# Patient Record
Sex: Female | Born: 1985 | Race: Black or African American | Hispanic: No | Marital: Single | Smoking: Never smoker
Health system: Southern US, Community
[De-identification: ages and names within clinical notes are randomized; demographics above are authoritative.]

---

## 2005-07-14 ENCOUNTER — Inpatient Hospital Stay: Payer: Self-pay | Admitting: Obstetrics and Gynecology

## 2006-10-31 ENCOUNTER — Emergency Department (HOSPITAL_COMMUNITY): Admission: EM | Admit: 2006-10-31 | Discharge: 2006-10-31 | Payer: Self-pay | Admitting: Emergency Medicine

## 2011-01-22 ENCOUNTER — Encounter (HOSPITAL_COMMUNITY): Payer: Self-pay

## 2011-01-22 ENCOUNTER — Emergency Department (HOSPITAL_COMMUNITY)
Admission: EM | Admit: 2011-01-22 | Discharge: 2011-01-22 | Disposition: A | Discharge status: Home / Self Care | Payer: Managed Care, Other (non HMO) | Attending: Emergency Medicine | Admitting: Emergency Medicine

## 2011-01-22 DIAGNOSIS — H6692 Otitis media, unspecified, left ear: Secondary | ICD-10-CM

## 2011-01-22 DIAGNOSIS — H6690 Otitis media, unspecified, unspecified ear: Secondary | ICD-10-CM | POA: Insufficient documentation

## 2011-01-22 MED ORDER — AMOXICILLIN 250 MG PO CAPS
500.0000 mg | ORAL_CAPSULE | Freq: Once | ORAL | Status: AC
  Administered 2011-01-22: 500 mg via ORAL
  Filled 2011-01-22: qty 1

## 2011-01-22 MED ORDER — AMOXICILLIN 500 MG PO CAPS: 500.0000 mg | ORAL_CAPSULE | Freq: Three times a day (TID) | ORAL | Status: AC

## 2011-01-22 MED ORDER — ANTIPYRINE-BENZOCAINE 5.4-1.4 % OT SOLN
3.0000 [drp] | Freq: Once | OTIC | Status: AC
  Administered 2011-01-22: 4 [drp] via OTIC
  Filled 2011-01-22 (×2): qty 10

## 2011-01-22 NOTE — ED Notes (Signed)
Lt earache, since this am. Scratchy throat.

## 2011-01-22 NOTE — ED Notes (Signed)
Pt reports earache in left ear and some sinus drainage since this morning.

## 2011-01-22 NOTE — ED Provider Notes (Signed)
History     CSN: [id number]  Arrival date & time 01/22/11  1647   First MD Initiated Contact with Patient 01/22/11 1720      Chief Complaint  Patient presents with  . Otalgia    (Consider location/radiation/quality/duration/timing/severity/associated sxs/prior treatment) HPI Comments: Patient c/o left ear pain since this morning.  She denies dizziness, hearing loss, or drainage.    Patient is a 26 y.o. female presenting with ear pain. The history is provided by the patient.  Otalgia This is a new problem. The current episode started 3 to 5 hours ago. There is pain in the left ear. The problem occurs constantly. The problem has not changed since onset.There has been no fever. The pain is moderate. Pertinent negatives include no ear discharge, no headaches, no hearing loss, no rhinorrhea, no sore throat, no vomiting, no neck pain, no cough and no rash. Her past medical history does not include hearing loss.    History reviewed. No pertinent past medical history.  Past Surgical History  Procedure Date  . Cesarean section     History reviewed. No pertinent family history.  History  Substance Use Topics  . Smoking status: Never Smoker   . Smokeless tobacco: Not on file  . Alcohol Use: No    OB History    Grav Para Term Preterm Abortions TAB SAB Ect Mult Living                  Review of Systems  Constitutional: Negative for fever and chills.  HENT: Positive for ear pain and sinus pressure. Negative for hearing loss, sore throat, facial swelling, rhinorrhea, trouble swallowing, neck pain and ear discharge.   Eyes: Negative for pain.  Respiratory: Negative for cough.   Gastrointestinal: Negative for vomiting.  Skin: Negative for rash.  Neurological: Negative for dizziness, numbness and headaches.  All other systems reviewed and are negative.    Allergies  Review of patient's allergies indicates no known allergies.  Home Medications  No current outpatient  prescriptions on file.  BP 117/64  Pulse 74  Temp(Src) 99.1 F (37.3 C) (Oral)  Resp 16  Ht 5\' 6"  (1.676 m)  Wt 217 lb (98.431 kg)  BMI 35.02 kg/m2  SpO2 99%  LMP 01/02/2011  Physical Exam  Nursing note and vitals reviewed. Constitutional: She is oriented to person, place, and time. She appears well-developed and well-nourished. No distress.  HENT:  Head: Normocephalic and atraumatic.  Right Ear: Tympanic membrane and ear canal normal.  Left Ear: No lacerations. No drainage. No mastoid tenderness. Tympanic membrane is erythematous. Tympanic membrane is not perforated. No hemotympanum.  Mouth/Throat: Uvula is midline, oropharynx is clear and moist and mucous membranes are normal.  Eyes: EOM are normal. Pupils are equal, round, and reactive to light.  Neck: Normal range of motion. Neck supple.  Cardiovascular: Normal rate, regular rhythm and normal heart sounds.   No murmur heard. Pulmonary/Chest: Effort normal and breath sounds normal.  Lymphadenopathy:    She has no cervical adenopathy.  Neurological: She is oriented to person, place, and time.  Skin: Skin is warm and dry.    ED Course  Procedures (including critical care time)       MDM    Patient is alert, NAd.  Non-toxic appearing.  Left otitis media        Kinston Magnan L. Jazmene Racz, [place] 01/22/11 1742

## 2011-01-22 NOTE — ED Provider Notes (Signed)
Medical screening examination/treatment/procedure(s) were performed by non-physician practitioner and as supervising physician I was immediately available for consultation/collaboration.   Caylea Foronda L Gearldene Fiorenza, MD 01/22/11 2044 

## 2013-05-20 ENCOUNTER — Observation Stay: Payer: Self-pay

## 2013-05-20 LAB — URINALYSIS, COMPLETE
Bacteria: NONE SEEN
Bilirubin,UR: NEGATIVE
Blood: NEGATIVE
Glucose,UR: NEGATIVE mg/dL (ref 0–75)
KETONE: NEGATIVE
Leukocyte Esterase: NEGATIVE
Nitrite: NEGATIVE
PH: 6 (ref 4.5–8.0)
PROTEIN: NEGATIVE
RBC,UR: 3 /HPF (ref 0–5)
SPECIFIC GRAVITY: 1.023 (ref 1.003–1.030)

## 2013-06-27 ENCOUNTER — Inpatient Hospital Stay: Payer: Self-pay

## 2013-06-27 LAB — CBC WITH DIFFERENTIAL/PLATELET
BASOS ABS: 0 10*3/uL (ref 0.0–0.1)
Basophil %: 0.2 %
EOS PCT: 0.6 %
Eosinophil #: 0.1 10*3/uL (ref 0.0–0.7)
HCT: 36.5 % (ref 35.0–47.0)
HGB: 11.9 g/dL — ABNORMAL LOW (ref 12.0–16.0)
LYMPHS ABS: 2 10*3/uL (ref 1.0–3.6)
LYMPHS PCT: 16.9 %
MCH: 26.5 pg (ref 26.0–34.0)
MCHC: 32.5 g/dL (ref 32.0–36.0)
MCV: 82 fL (ref 80–100)
MONOS PCT: 6.6 %
Monocyte #: 0.8 x10 3/mm (ref 0.2–0.9)
NEUTROS PCT: 75.7 %
Neutrophil #: 9.1 10*3/uL — ABNORMAL HIGH (ref 1.4–6.5)
PLATELETS: 211 10*3/uL (ref 150–440)
RBC: 4.48 10*6/uL (ref 3.80–5.20)
RDW: 15.1 % — ABNORMAL HIGH (ref 11.5–14.5)
WBC: 12 10*3/uL — ABNORMAL HIGH (ref 3.6–11.0)

## 2013-06-28 LAB — HEMATOCRIT: HCT: 31.1 % — ABNORMAL LOW (ref 35.0–47.0)

## 2013-06-28 LAB — PATHOLOGY REPORT

## 2014-05-06 NOTE — Op Note (Signed)
PATIENT NAME:  Carmen Dudley, Carmen Dudley MR#:  [id number] DATE OF BIRTH:  [birth year]  DATE OF PROCEDURE:  06/27/2013  PREOPERATIVE DIAGNOSES: 1. A 38 + 6 weeks estimated gestational age.  2. Active labor.  3. Prior cesarean section.  4. Elective permanent sterilization.   POSTOPERATIVE DIAGNOSES: 1. A 38 + 6 weeks estimated gestational age.  2. Active labor.  3. Prior cesarean section.  4. Elective permanent sterilization.   PROCEDURE: 1. Repeat low transverse cesarean section.  3. Bilateral tubal ligation, [place].   ANESTHESIA: Spinal.  SURGEON: Jennell Cornerhomas Asahel Risden, M.D.   INDICATION: A 29 year old gravida 2, para 1 patient with EDC of 07/05/2013 presented with active contractions. The cervix was dilated with 2 to 3 cm and patient was scheduled for a repeat cesarean section and elective permanent sterilization.   PROCEDURE: After adequate spinal anesthesia, the patient was placed in the dorsal supine position with a hip roll under the right side. The patient previously received 3 grams IV Ancef prior to commencement of the case. The patient's abdomen was prepped and draped in normal sterile fashion. A time out was performed and everybody was in agreement of the planned procedure. A Pfannenstiel incision was made two fingerbreadths above the symphysis pubis. Sharp dissection was used to identify the fascia. The fascia was opened in the midline and opened in a transverse fashion. The superior aspect of the fascia was grasped with Kocher clamps and the recti muscles dissected free. The inferior aspect of the fascia was grasped with Kocher clamps and pyramidalis muscle was dissected free. Entry into the peritoneal cavity was accomplished sharply. The vesicouterine peritoneal fold was identified and was opened and a bladder flap was created and the bladder was reflected inferiorly. A low transverse uterine incision was made. Upon entry into the endometrial cavity, thin meconium stained fluid was noted.  The fetal head was delivered through the incision followed by shoulders and the body. A vigorous spontaneous cry on the abdomen was noted. Oral and nasopharynx were suctioned. A vigorous female passed to nursery staff that assigned Apgar scores of 9 and 9. Placenta was manually delivered and the uterus was exteriorized and wiped clean with laparotomy tape. The ring forceps was used to open the cervix and this was passed off the operative field. The uterine incision was closed with 1 chromic suture in a running locking fashion with good approximation of edges. Good hemostasis was noted. The posterior cul-de-sac was irrigated and suctioned.   Attention was then directed to the patient's right fallopian tube there was grasped at the midportion of the fallopian tube. Two separate 0 plain gut sutures were applied and a 1.5 cm portion of fallopian tube was removed. Good hemostasis was noted. A similar procedure was repeated on the patient's left fallopian tube. After placing two separate 0 plain gut sutures, a 1.5 cm portion of fallopian tube was removed. The uterus was placed back into the abdominal cavity.  The paracolic gutters were wiped clean with laparotomy tape and the tubal ligation sites appeared hemostatic. There was one rectal muscle bleeder that was noted and a figure-of-eight was placed with 2-0 Vicryl with good hemostasis. The uterine incision again appeared hemostatic. Interceed was placed over the uterine incision in a T-shaped fashion. The On-Q pump catheters were then placed from an infraumbilical position to a silk fascial position and the fascia was then closed over top these with 0 Vicryl suture in a running nonlocking fashion. Subcutaneous tissues were irrigated and bovied for hemostasis and the skin  was reapproximated with staples. On-Q pump catheters were secured at the skin level with Dermabond and were anchored to the skin with Steri-Strips and a Tegaderm was placed over top of this. Each  catheter was loaded with 5 mL of 0.5% lidocaine.   ESTIMATED BLOOD LOSS: 700 mL   INTRAOPERATIVE FLUIDS: 1300 mL.   The patient tolerated the procedure well and was taken to the recovery room in good condition.    ____________________________ Suzy Bouchard, MD tjs:sg D: 06/27/2013 13:46:00 ET T: 06/27/2013 16:24:27 ET JOB#: [id number]  cc: Suzy Bouchard, MD, <Dictator> Suzy Bouchard MD ELECTRONICALLY SIGNED 07/02/2013 11:55

## 2014-05-23 NOTE — H&P (Signed)
L&D Evaluation:  History:  HPI 27 G2 P1 at 38+6 weeks  scheduled for a repeat c/s presents today with active contractions and cervix is 2-3 cm . Pt reconfirms desire for BTL   Patient's Medical History No Chronic Illness   Patient's Surgical History Previous C-Section   Medications Pre Natal Vitamins   Allergies NKDA   Social History none   Family History Non-Contributory   ROS:  ROS All systems were reviewed.  HEENT, CNS, GI, GU, Respiratory, CV, Renal and Musculoskeletal systems were found to be normal.   Exam:  Vital Signs stable   General no apparent distress   Chest clear   Heart normal sinus rhythm   Estimated Fetal Weight Average for gestational age   Fetal Position vtx   Back no CVAT   Edema no edema   Pelvic by rn 2-3 cm   Mebranes Intact   FHT normal rate with no decels   Impression:  Impression scheduled c/s in  labor   Plan:  Plan repeat LTCS and BTL and on Q pump placement   Electronic Signatures: Faizan Geraci, Ihor Austinhomas J (MD)  (Signed 15-Jun-15 12:00)  Authored: L&D Evaluation   Last Updated: 15-Jun-15 12:00 by Suzy BouchardSchermerhorn, Alben Jepsen J (MD)

## 2014-05-23 NOTE — H&P (Signed)
L&D Evaluation:  History:  HPI 29 yo G2P1001 with LMP of 09/29/12 & EDD of 07/05/13 here for c/o "vag spotting today" [place]. No UC's, decreased FM, vaginal bleeding or spotting, or any other symptoms.   Presents with vaginal bleeding   Patient's Medical History Obesity, Hx Twins   Medications Pre Natal Vitamins   Allergies NKDA   Social History none   ROS:  ROS All systems were reviewed.  HEENT, CNS, GI, GU, Respiratory, CV, Renal and Musculoskeletal systems were found to be normal.   Exam:  Vital Signs stable   General no apparent distress   Mental Status clear   Chest clear   Heart normal sinus rhythm, no murmur/gallop/rubs   Abdomen gravid, non-tender   Estimated Fetal Weight Average for gestational age   Back no CVAT   Reflexes 1+   Clonus negative   Pelvic cervix closed and thick   Mebranes Intact   FHT normal rate with no decels   Ucx absent   Skin dry   Lymph no lymphadenopathy   Impression:  Impression IUP at 33 4/7 weeks   Plan:  Plan discharge   Comments Cautioned: if VB return here. [facility].   Electronic Signatures: Sharee PimpleJones, Michaline Kindig W (CNM)  (Signed 08-May-15 19:08)  Authored: L&D Evaluation   Last Updated: 08-May-15 19:08 by Sharee PimpleJones, Advay Volante W (CNM)

## 2017-09-12 ENCOUNTER — Emergency Department (HOSPITAL_COMMUNITY)
Admission: EM | Admit: 2017-09-12 | Discharge: 2017-09-12 | Disposition: A | Discharge status: Home / Self Care | Payer: BC Managed Care – PPO | Attending: Emergency Medicine | Admitting: Emergency Medicine

## 2017-09-12 ENCOUNTER — Encounter (HOSPITAL_COMMUNITY): Payer: Self-pay | Admitting: Emergency Medicine

## 2017-09-12 ENCOUNTER — Other Ambulatory Visit: Payer: Self-pay

## 2017-09-12 DIAGNOSIS — Y939 Activity, unspecified: Secondary | ICD-10-CM | POA: Insufficient documentation

## 2017-09-12 DIAGNOSIS — M25512 Pain in left shoulder: Secondary | ICD-10-CM | POA: Insufficient documentation

## 2017-09-12 DIAGNOSIS — Y999 Unspecified external cause status: Secondary | ICD-10-CM | POA: Diagnosis not present

## 2017-09-12 DIAGNOSIS — Y929 Unspecified place or not applicable: Secondary | ICD-10-CM | POA: Diagnosis not present

## 2017-09-12 MED ORDER — ACETAMINOPHEN 325 MG PO TABS
650.0000 mg | ORAL_TABLET | ORAL | Status: DC | PRN
  Administered 2017-09-12: 650 mg via ORAL
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
## 2017-09-12 NOTE — ED Notes (Signed)
See edp's assessment. °
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
## 2017-09-15 ENCOUNTER — Emergency Department
Admission: EM | Admit: 2017-09-15 | Discharge: 2017-09-15 | Disposition: A | Discharge status: Home / Self Care | Payer: BC Managed Care – PPO | Attending: Emergency Medicine | Admitting: Emergency Medicine

## 2017-09-15 ENCOUNTER — Emergency Department: Payer: BC Managed Care – PPO

## 2017-09-15 ENCOUNTER — Encounter: Payer: Self-pay | Admitting: Emergency Medicine

## 2017-09-15 DIAGNOSIS — Y92410 Unspecified street and highway as the place of occurrence of the external cause: Secondary | ICD-10-CM | POA: Diagnosis not present

## 2017-09-15 DIAGNOSIS — S3992XA Unspecified injury of lower back, initial encounter: Secondary | ICD-10-CM | POA: Diagnosis present

## 2017-09-15 DIAGNOSIS — S29012A Strain of muscle and tendon of back wall of thorax, initial encounter: Secondary | ICD-10-CM | POA: Insufficient documentation

## 2017-09-15 DIAGNOSIS — Z3202 Encounter for pregnancy test, result negative: Secondary | ICD-10-CM | POA: Insufficient documentation

## 2017-09-15 DIAGNOSIS — Y999 Unspecified external cause status: Secondary | ICD-10-CM | POA: Insufficient documentation

## 2017-09-15 DIAGNOSIS — Y9389 Activity, other specified: Secondary | ICD-10-CM | POA: Insufficient documentation

## 2017-09-15 DIAGNOSIS — S39012A Strain of muscle, fascia and tendon of lower back, initial encounter: Secondary | ICD-10-CM | POA: Insufficient documentation

## 2017-09-15 MED ORDER — ONDANSETRON HCL 4 MG/2ML IJ SOLN: 4.0000 mg | Freq: Once | INTRAMUSCULAR | Status: DC

## 2017-09-15 MED ORDER — MELOXICAM 7.5 MG PO TABS
15.0000 mg | ORAL_TABLET | Freq: Once | ORAL | Status: AC
  Administered 2017-09-15: 15 mg via ORAL
  Filled 2017-09-15: qty 2

## 2017-09-15 MED ORDER — MELOXICAM 15 MG PO TABS: 15.0000 mg | ORAL_TABLET | Freq: Every day | ORAL | 0 refills | Status: AC

## 2017-09-15 MED ORDER — METHOCARBAMOL 500 MG PO TABS: 500.0000 mg | ORAL_TABLET | Freq: Four times a day (QID) | ORAL | 0 refills | Status: AC

## 2017-09-15 MED ORDER — FENTANYL CITRATE (PF) 100 MCG/2ML IJ SOLN
100.0000 ug | Freq: Once | INTRAMUSCULAR | Status: DC
Start: 2017-09-15 — End: 2017-09-15
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
## 2017-09-16 LAB — POCT PREGNANCY, URINE: PREG TEST UR: NEGATIVE

## 2018-07-09 DIAGNOSIS — Z1329 Encounter for screening for other suspected endocrine disorder: Secondary | ICD-10-CM | POA: Diagnosis not present

## 2018-07-09 DIAGNOSIS — Z130 Encounter for screening for diseases of the blood and blood-forming organs and certain disorders involving the immune mechanism: Secondary | ICD-10-CM | POA: Diagnosis not present

## 2018-07-09 DIAGNOSIS — Z0000 Encounter for general adult medical examination without abnormal findings: Secondary | ICD-10-CM | POA: Diagnosis not present

## 2018-07-09 DIAGNOSIS — Z131 Encounter for screening for diabetes mellitus: Secondary | ICD-10-CM | POA: Diagnosis not present

## 2018-07-09 DIAGNOSIS — Z13220 Encounter for screening for lipoid disorders: Secondary | ICD-10-CM | POA: Diagnosis not present

## 2018-11-09 DIAGNOSIS — H5213 Myopia, bilateral: Secondary | ICD-10-CM | POA: Diagnosis not present

## 2018-11-09 DIAGNOSIS — Z135 Encounter for screening for eye and ear disorders: Secondary | ICD-10-CM | POA: Diagnosis not present

## 2019-05-18 DIAGNOSIS — Z20822 Contact with and (suspected) exposure to covid-19: Secondary | ICD-10-CM | POA: Diagnosis not present

## 2019-07-11 DIAGNOSIS — Z0000 Encounter for general adult medical examination without abnormal findings: Secondary | ICD-10-CM | POA: Diagnosis not present

## 2019-07-11 DIAGNOSIS — Z1331 Encounter for screening for depression: Secondary | ICD-10-CM | POA: Diagnosis not present
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
## 2019-12-20 DIAGNOSIS — H5213 Myopia, bilateral: Secondary | ICD-10-CM | POA: Diagnosis not present

## 2020-01-31 DIAGNOSIS — Z20822 Contact with and (suspected) exposure to covid-19: Secondary | ICD-10-CM | POA: Diagnosis not present

## 2020-07-17 DIAGNOSIS — Z13220 Encounter for screening for lipoid disorders: Secondary | ICD-10-CM | POA: Diagnosis not present

## 2020-07-17 DIAGNOSIS — Z131 Encounter for screening for diabetes mellitus: Secondary | ICD-10-CM | POA: Diagnosis not present

## 2020-07-17 DIAGNOSIS — Z0000 Encounter for general adult medical examination without abnormal findings: Secondary | ICD-10-CM | POA: Diagnosis not present

## 2020-07-17 DIAGNOSIS — Z124 Encounter for screening for malignant neoplasm of cervix: Secondary | ICD-10-CM | POA: Diagnosis not present

## 2020-07-17 DIAGNOSIS — Z1331 Encounter for screening for depression: Secondary | ICD-10-CM | POA: Diagnosis not present

## 2020-07-25 DIAGNOSIS — Z13220 Encounter for screening for lipoid disorders: Secondary | ICD-10-CM | POA: Diagnosis not present

## 2020-07-25 DIAGNOSIS — Z131 Encounter for screening for diabetes mellitus: Secondary | ICD-10-CM | POA: Diagnosis not present

## 2020-07-25 DIAGNOSIS — Z130 Encounter for screening for diseases of the blood and blood-forming organs and certain disorders involving the immune mechanism: Secondary | ICD-10-CM | POA: Diagnosis not present

## 2021-02-28 DIAGNOSIS — Z23 Encounter for immunization: Secondary | ICD-10-CM | POA: Diagnosis not present

## 2021-04-05 DIAGNOSIS — Z23 Encounter for immunization: Secondary | ICD-10-CM | POA: Diagnosis not present

## 2021-09-13 ENCOUNTER — Telehealth: Payer: Self-pay | Admitting: Student

## 2021-09-13 NOTE — Telephone Encounter (Signed)
error 

## 2023-07-29 DIAGNOSIS — R7303 Prediabetes: Secondary | ICD-10-CM | POA: Diagnosis not present

## 2023-07-29 DIAGNOSIS — Z13220 Encounter for screening for lipoid disorders: Secondary | ICD-10-CM | POA: Diagnosis not present

## 2023-08-18 DIAGNOSIS — Z13220 Encounter for screening for lipoid disorders: Secondary | ICD-10-CM | POA: Diagnosis not present

## 2023-08-18 DIAGNOSIS — D649 Anemia, unspecified: Secondary | ICD-10-CM | POA: Diagnosis not present

## 2023-08-18 DIAGNOSIS — R7303 Prediabetes: Secondary | ICD-10-CM | POA: Diagnosis not present

## 2023-08-18 DIAGNOSIS — Z1329 Encounter for screening for other suspected endocrine disorder: Secondary | ICD-10-CM | POA: Diagnosis not present

## 2023-08-18 DIAGNOSIS — Z23 Encounter for immunization: Secondary | ICD-10-CM | POA: Diagnosis not present

## 2023-08-18 DIAGNOSIS — Z124 Encounter for screening for malignant neoplasm of cervix: Secondary | ICD-10-CM | POA: Diagnosis not present

## 2023-08-18 DIAGNOSIS — Z6841 Body Mass Index (BMI) 40.0 and over, adult: Secondary | ICD-10-CM | POA: Diagnosis not present

## 2023-08-18 DIAGNOSIS — Z0000 Encounter for general adult medical examination without abnormal findings: Secondary | ICD-10-CM | POA: Diagnosis not present

## 2023-08-18 DIAGNOSIS — Z1331 Encounter for screening for depression: Secondary | ICD-10-CM | POA: Diagnosis not present
